# Patient Record
Sex: Female | Born: 1955 | Race: Asian | Hispanic: No | Marital: Married | Smoking: Never smoker
Health system: Southern US, Community
[De-identification: ages and names within clinical notes are randomized; demographics above are authoritative.]

## PROBLEM LIST (undated history)

## (undated) DIAGNOSIS — E119 Type 2 diabetes mellitus without complications: Secondary | ICD-10-CM

## (undated) DIAGNOSIS — K219 Gastro-esophageal reflux disease without esophagitis: Secondary | ICD-10-CM

## (undated) DIAGNOSIS — M1990 Unspecified osteoarthritis, unspecified site: Secondary | ICD-10-CM

## (undated) DIAGNOSIS — I10 Essential (primary) hypertension: Secondary | ICD-10-CM

## (undated) DIAGNOSIS — H539 Unspecified visual disturbance: Secondary | ICD-10-CM

## (undated) HISTORY — PX: ESOPHAGOGASTRODUODENOSCOPY ENDOSCOPY: SHX5814

## (undated) HISTORY — DX: Gastro-esophageal reflux disease without esophagitis: K21.9

## (undated) HISTORY — DX: Essential (primary) hypertension: I10

## (undated) HISTORY — DX: Type 2 diabetes mellitus without complications: E11.9

## (undated) HISTORY — DX: Unspecified visual disturbance: H53.9

## (undated) HISTORY — DX: Unspecified osteoarthritis, unspecified site: M19.90

---

## 2018-05-17 ENCOUNTER — Ambulatory Visit: Payer: TRICARE Prime—HMO

## 2018-05-18 ENCOUNTER — Ambulatory Visit: Payer: TRICARE Prime—HMO | Attending: Gastroenterology

## 2018-05-26 ENCOUNTER — Ambulatory Visit: Payer: Self-pay

## 2018-05-26 ENCOUNTER — Ambulatory Visit: Payer: TRICARE Prime—HMO | Admitting: Pain Medicine

## 2018-05-26 ENCOUNTER — Encounter
Admission: RE | Disposition: A | Discharge status: Home / Self Care | Payer: Self-pay | Source: Ambulatory Visit | Attending: Gastroenterology

## 2018-05-26 ENCOUNTER — Ambulatory Visit
Admission: RE | Admit: 2018-05-26 | Discharge: 2018-05-26 | Disposition: A | Discharge status: Home / Self Care | Payer: TRICARE Prime—HMO | Source: Ambulatory Visit | Attending: Gastroenterology | Admitting: Gastroenterology

## 2018-05-26 DIAGNOSIS — I10 Essential (primary) hypertension: Secondary | ICD-10-CM | POA: Insufficient documentation

## 2018-05-26 DIAGNOSIS — K2950 Unspecified chronic gastritis without bleeding: Secondary | ICD-10-CM

## 2018-05-26 DIAGNOSIS — K3189 Other diseases of stomach and duodenum: Secondary | ICD-10-CM | POA: Insufficient documentation

## 2018-05-26 DIAGNOSIS — K219 Gastro-esophageal reflux disease without esophagitis: Secondary | ICD-10-CM

## 2018-05-26 DIAGNOSIS — K2940 Chronic atrophic gastritis without bleeding: Secondary | ICD-10-CM | POA: Insufficient documentation

## 2018-05-26 DIAGNOSIS — E119 Type 2 diabetes mellitus without complications: Secondary | ICD-10-CM | POA: Insufficient documentation

## 2018-05-26 DIAGNOSIS — Z7984 Long term (current) use of oral hypoglycemic drugs: Secondary | ICD-10-CM | POA: Insufficient documentation

## 2018-05-26 DIAGNOSIS — R12 Heartburn: Secondary | ICD-10-CM | POA: Insufficient documentation

## 2018-05-26 LAB — GLUCOSE WHOLE BLOOD - POCT: Whole Blood Glucose POCT: 95 mg/dL (ref 70–100)

## 2018-05-26 SURGERY — DONT USE, USE 1095-ESOPHAGOGASTRODUODENOSCOPY (EGD), DIAGNOSTIC
Anesthesia: Anesthesia General | Site: Abdomen | Wound class: Clean Contaminated

## 2018-05-26 MED ORDER — LIDOCAINE HCL (CARDIAC) 100 MG/5ML IV (WRAP)
PREFILLED_SYRINGE | INTRAVENOUS | Status: AC
Start: 2018-05-26 — End: ?
  Filled 2018-05-26: qty 5

## 2018-05-26 MED ORDER — PROPOFOL 10 MG/ML IV EMUL (WRAP)
INTRAVENOUS | Status: AC
Start: 2018-05-26 — End: ?
  Filled 2018-05-26: qty 20

## 2018-05-26 MED ORDER — FENTANYL CITRATE (PF) 50 MCG/ML IJ SOLN (WRAP)
INTRAMUSCULAR | Status: AC
Start: 2018-05-26 — End: ?
  Filled 2018-05-26: qty 2

## 2018-05-26 MED ORDER — LIDOCAINE HCL 2 % IJ SOLN
INTRAMUSCULAR | Status: DC | PRN
Start: 2018-05-26 — End: 2018-05-26
  Administered 2018-05-26: 100 mg

## 2018-05-26 MED ORDER — PROPOFOL INFUSION 10 MG/ML
INTRAVENOUS | Status: DC | PRN
Start: 2018-05-26 — End: 2018-05-26
  Administered 2018-05-26: 60 mg via INTRAVENOUS

## 2018-05-26 MED ORDER — FENTANYL CITRATE (PF) 50 MCG/ML IJ SOLN (WRAP)
INTRAMUSCULAR | Status: DC | PRN
Start: 2018-05-26 — End: 2018-05-26
  Administered 2018-05-26: 25 ug via INTRAVENOUS

## 2018-05-26 MED ORDER — SODIUM CHLORIDE 0.9 % IV SOLN
INTRAVENOUS | Status: DC
Start: 2018-05-26 — End: 2018-05-26

## 2018-05-26 SURGICAL SUPPLY — 44 items
BLOCK BITE MAXI 60FR LF STRD STRAP SDPRT (Procedure Accessories) ×1
BLOCK BITE OD60 FR STURDY STRAP SIDEPORT (Procedure Accessories) ×1
BLOCK BITE OD60 FR STURDY STRAP SIDEPORT DENTAL RETENTION RIM MAXI (Procedure Accessories) ×1 IMPLANT
CATHETER BALLOON DILATATION CRE 2.8 MM (Balloons)
CATHETER BALLOON DILATATION CRE PEBAX (Balloons)
CATHETER OD10-11-12 MM ODSEC6 FR L180 CM CRE BALLOON DILATATION L8 CM (Balloons) IMPLANT
CATHETER OD10-11-12 MM ODSEC6 FR L180 CM CREâ„¢ BALLOON DILATATION L8 CM (Balloons) IMPLANT
CATHETER OD15-16.5-18 MM ODSEC6 FR L180 CM CRE BALLOON DILATATION L8 (Balloons) IMPLANT
CATHETER OD15-16.5-18 MM ODSEC6 FR L180 CM CREâ„¢ BALLOON DILATATION L8 (Balloons) IMPLANT
CATHETER OD6 FR ODSEC12-13.5-15 MM L180 CM CRE BALLOON DILATATION L8 (Balloons) IMPLANT
CATHETER OD6 FR ODSEC12-13.5-15 MM L180 CM CREâ„¢ BALLOON DILATATION L8 (Balloons) IMPLANT
CATHETER OD6-7-8 MM ODSEC6 FR L180 CM CRE BALLOON DILATATION L8 CM (Balloons) IMPLANT
CATHETER OD6-7-8 MM ODSEC6 FR L180 CM CREâ„¢ BALLOON DILATATION L8 CM (Balloons) IMPLANT
DILATOR ESCP PEBAX 2.8MM CRE 10-11-12MM (Balloons)
DILATOR ESCP PEBAX 2.8MM CRE 15-16.5-18 (Balloons)
DILATOR ESCP PEBAX 2.8MM CRE 6-7-8MM 6FR (Balloons)
DILATOR ESCP PEBAX CRE 6FR 12-13.5-15MM (Balloons)
ELECTRODE ADULT PATIENT RETURN L9 FT REM POLYHESIVE ACRYLIC FOAM (Procedure Accessories) IMPLANT
ELECTRODE PATIENT RETURN L9 FT VALLEYLAB (Procedure Accessories)
ELECTRODE PT RTN RM PHSV ACRL FM C30- LB (Procedure Accessories)
FORCEPS BIOPSY L240 CM LARGE CAPACITY (Instrument) ×1
FORCEPS BIOPSY L240 CM MICROMESH TEETH STREAMLINE CATHETER NEEDLE (Instrument) ×1 IMPLANT
FORCEPS BX SS LG CPC RJ 4 2.4MM 240CM (Instrument) ×1
GLOVE EXAM LARGE NITRILE CHEMOTHERAPY POWDER FREE SENSE OATMEAL (Glove) ×2 IMPLANT
GLOVE EXAM LARGE NITRILE POWDER FREE SENSE OATMEAL (Glove) ×2 IMPLANT
GLOVE EXAM NITRILE RESTORE LG (Glove) ×2
GLV EXAM NITRILE RESTORE LG (Glove) ×4
GOWN ISL PP PE REG LG LF FULL BCK NK TIE (Gown) ×4
GOWN ISOLATION REGULAR LARGE FULL BACK NECK TIE ELASTIC CUFF (Gown) ×2 IMPLANT
PROBE ELECTROSURGICAL L220 CM FLEXIBLE (Procedure Accessories)
PROBE ELECTROSURGICAL L220 CM FLEXIBLE STRAIGHT FIRE OD2.3 MM FIAPC (Procedure Accessories) IMPLANT
PROBE ESURG FIAPC 2.3MM 220CM STRL FLXB (Procedure Accessories)
SNARE ESCP MIC CPTVTR 13MM 240IN STRL (GE Lab Supplies)
SNARE SMALL HEXAGON CAPTIVATOR STIFF ENDOSCOPIC POLYPECTOMY (GE Lab Supplies) IMPLANT
SPONGE GAUZE L4 IN X W4 IN 16 PLY (Dressing) ×1
SPONGE GAUZE L4 IN X W4 IN 16 PLY MAXIMUM ABSORBENT USP TYPE VII (Dressing) ×1 IMPLANT
SPONGE GZE CTTN CRTY 4X4IN LF NS 16 PLY (Dressing) ×1
SYRINGE 50 ML GRADUATE NONPYROGENIC DEHP (Syringes, Needles)
SYRINGE 50 ML GRADUATE NONPYROGENIC DEHP FREE PVC FREE BD MEDICAL (Syringes, Needles) IMPLANT
SYRINGE INFL 60ML ALN II STRL GA DISP (Syringes, Needles)
SYRINGE INFLATION 60 ML GAUGE CRE (Syringes, Needles) IMPLANT
SYRINGE MED 50ML LF STRL GRAD N-PYRG (Syringes, Needles)
WATER STERILE PLASTIC POUR BOTTLE 250 ML (Irrigation Solutions) ×1 IMPLANT
WATER STRL 250ML LF PLS PR BTL (Irrigation Solutions) ×1

## 2018-05-26 NOTE — Transfer of Care (Signed)
Anesthesia Transfer of Care Note    Patient: Jessica Yang    Procedures performed: Procedure(s) with comments:  EGD - EGD  Q1=N/A    Anesthesia type: General TIVA    Patient location:Phase II PACU    Last vitals:   Vitals:    05/26/18 1000   BP: 103/69   Pulse: (!) 59   Resp: 16   Temp: 37.1 C (98.7 F)   SpO2: 97%       Post pain: Patient not complaining of pain, continue current therapy      Mental Status:awake    Respiratory Function: tolerating room air    Cardiovascular: stable    Nausea/Vomiting: patient not complaining of nausea or vomiting    Hydration Status: adequate    Post assessment: no apparent anesthetic complications    Signed by: Hoyle Sauer Ayodele Sangalang  05/26/18 11:02 AM

## 2018-05-26 NOTE — Anesthesia Postprocedure Evaluation (Signed)
Anesthesia Post Evaluation    Patient: Jessica Yang    Procedure(s) with comments:  EGD - EGD  Q1=N/A    Anesthesia type: general    Last Vitals:   Vitals Value Taken Time   BP 97/55 05/26/2018 11:00 AM   Temp 36.3 C (97.4 F) 05/26/2018 11:00 AM   Pulse 62 05/26/2018 11:00 AM   Resp 16 05/26/2018 11:00 AM   SpO2 94 % 05/26/2018 11:00 AM                 Anesthesia Post Evaluation:     Patient Evaluated: PACU  Patient Participation: complete - patient participated  Level of Consciousness: awake  Pain Score: 0  Pain Management: adequate    Airway Patency: patent    Anesthetic complications: No      PONV Status: none    Cardiovascular status: stable  Respiratory status: room air  Hydration status: stable        Signed by: Juanell Fairly, 05/26/2018 11:12 AM

## 2018-05-26 NOTE — Progress Notes (Signed)
Discharge instructions reviewed with patient and responsible adult at this time.  All questions answered at this time.  A signed copy of discharge instructions by responsible adult placed in chart.  A copy of discharge instructions given to patient and responsible adult. Plan to d/c home.

## 2018-05-26 NOTE — H&P (Signed)
GI PRE PROCEDURE NOTE    Proceduralist Comments:   Review of Systems and Past Medical / Surgical History performed: Yes     Indications:GERD     Previous Adverse Reaction to Anesthesia or Sedation (if yes, describe): No    Physical Exam / Laboratory Data (If applicable)   Airway Classification: Class II    General: Alert and cooperative  Lungs: Lungs clear to auscultation  Cardiac: RRR, normal S1S2.    Abdomen: Soft, non tender. Normal active bowel sounds  Other:     No labs drawn    American Society of Anesthesiologists (ASA) Physical Status Classification:   ASA 2 - Patient with mild systemic disease with no functional limitations    Planned Sedation:   Deep sedation with anesthesia    Attestation:   Jessica Yang has been reassessed immediately prior to the procedure and is an appropriate candidate for the planned sedation and procedure. Risks, benefits and alternatives to the planned procedure and sedation have been explained to the patient or guardian:  yes        Signed by: Karen Kays

## 2018-05-26 NOTE — Anesthesia Preprocedure Evaluation (Addendum)
Anesthesia Evaluation    AIRWAY    Mallampati: II    TM distance: >3 FB  Neck ROM: full  Mouth Opening:full  Planned to use difficult airway equipment: No CARDIOVASCULAR    cardiovascular exam normal, regular and normal       DENTAL         PULMONARY    pulmonary exam normal     OTHER FINDINGS                  Relevant Problems   No relevant active problems       PSS Anesthesia Comments: DM, HTN,         Anesthesia Plan    ASA 2     general                     intravenous induction   Detailed anesthesia plan: general IV  Monitors/Adjuncts: other    Post Op: other  Trial extubation is not planned.  Post op pain management: per surgeon    informed consent obtained                   Signed by: Juanell Fairly 05/26/18 7:21 AM

## 2018-05-26 NOTE — Discharge Instr - AVS First Page (Addendum)
Endoscopy Discharge Instructions  General Instructions:  1. Following sedation, your judgement, perception, and coordination are considered impaired. Even though you may feel awake and alert, you are considered legally intoxicated. Therefore, until the next morning;   Do not Drive   Do not operate appliances or equipment that requires reaction time (e.g. Stove,electrical tools, machinery)   Do not sign legal documents or be involved in important decisions.   Do not smoke if alone   Do not drink alcoholic beverages   Go directly home and rest for several hours before resuming your routine activities.   It is highly recommended to have a responsible adult stay with you for the next 24 hours    2. Tenderness, swelling or pain may occur at the IV site where you received sedation. If you experience this, apply warm soaks to the area. Notify your physician if this persists.    Instructions Specific To Procedures - Report To Physician Any Of The Following:    Upper Endoscopy, ERCP, Dilations   1. Pain in Chest   2. Nausea/vomitting   3. Fevers/Chills within 24 hours after procedure. Temp>101deg F   4. Severe and persistent abdominal pain and bloating     In Addition:   Mild throat soreness may follow this procedure. Warm salt water gargling or lozenges of your choice will most likely relieve your discomfort.  Cold drinks and popsicles may be helpful as well.    Additional Discharge Instructions  Your diet after the procedure: Continue prior diet  Special Instructions: none  Prescriptions given: No  Patient education literature given; Yes      If you have questions or problems contact your MD immediately. If you need immediate attention, call your MD, 911 and/or go to nearest emergency room.    Post Anesthesia Discharge Instructions    Although you may be awake and alert in the recovery room, small amounts of anesthetic remain in your system for about 24 hours.  You may feel tired and sleepy during this time.       You are advised to go directly home from the hospital.    Plan to stay at home and rest for the remainder of the day.    It is advisable to have someone with you at home for 24 hours after surgery.    Do not operate a motor vehicle, or any mechanical or electrical equipment for the next 24 hours.      Be careful when you are walking around, you may become dizzy.  The effects of anesthesia and/or medications are still present and drowsiness may occur    Do not consume alcohol, tranquilizers, sleeping medications, or any other non prescribed medication for the remainder of the day.    Diet:  begin with liquids, progress your diet as tolerated or as directed by your surgeon.  Nausea and vomiting may occur in the next 24 hours.

## 2018-05-27 ENCOUNTER — Encounter: Payer: Self-pay | Admitting: Gastroenterology

## 2021-03-27 ENCOUNTER — Ambulatory Visit: Payer: Self-pay | Attending: Internal Medicine

## 2021-03-27 DIAGNOSIS — Z23 Encounter for immunization: Secondary | ICD-10-CM

## 2021-03-27 NOTE — Progress Notes (Signed)
   Covid-19 Vaccination Clinic  Name:  Allison Hale    MRN: [id number] DOB: September 08, 1955  03/27/2021  [name] was observed post Covid-19 immunization for 15 minutes without incident. She was provided with Vaccine Information Sheet and instruction to access the V-Safe system.   [name] was instructed to call 911 with any severe reactions post vaccine: Difficulty breathing  Swelling of face and throat  A fast heartbeat  A bad rash all over body  Dizziness and weakness   Immunizations Administered     Name Date Dose VIS Date Route   Moderna Covid-19 Booster Vaccine 03/27/2021 12:54 PM 0.25 mL 06/12/2020 Intramuscular   Manufacturer: Moderna   Lot: [id number]   NDC: 70488-891-69

## 2021-03-31 ENCOUNTER — Other Ambulatory Visit (HOSPITAL_BASED_OUTPATIENT_CLINIC_OR_DEPARTMENT_OTHER): Payer: Self-pay

## 2021-03-31 MED ORDER — COVID-19 MRNA VACC (MODERNA) 100 MCG/0.5ML IM SUSP
INTRAMUSCULAR | 0 refills | Status: AC
  Filled 2021-03-31: qty 0.25, 1d supply, fill #0

## 2021-09-26 ENCOUNTER — Emergency Department (HOSPITAL_BASED_OUTPATIENT_CLINIC_OR_DEPARTMENT_OTHER)

## 2021-09-26 ENCOUNTER — Encounter (HOSPITAL_BASED_OUTPATIENT_CLINIC_OR_DEPARTMENT_OTHER): Payer: Self-pay | Admitting: Emergency Medicine

## 2021-09-26 ENCOUNTER — Other Ambulatory Visit: Payer: Self-pay

## 2021-09-26 ENCOUNTER — Emergency Department (HOSPITAL_BASED_OUTPATIENT_CLINIC_OR_DEPARTMENT_OTHER)
Admission: EM | Admit: 2021-09-26 | Discharge: 2021-09-26 | Disposition: A | Discharge status: Home / Self Care | Attending: Emergency Medicine | Admitting: Emergency Medicine

## 2021-09-26 DIAGNOSIS — R1011 Right upper quadrant pain: Secondary | ICD-10-CM | POA: Diagnosis present

## 2021-09-26 DIAGNOSIS — R1031 Right lower quadrant pain: Secondary | ICD-10-CM | POA: Insufficient documentation

## 2021-09-26 DIAGNOSIS — Z20822 Contact with and (suspected) exposure to covid-19: Secondary | ICD-10-CM | POA: Insufficient documentation

## 2021-09-26 DIAGNOSIS — J189 Pneumonia, unspecified organism: Secondary | ICD-10-CM

## 2021-09-26 HISTORY — DX: Type 2 diabetes mellitus without complications: E11.9

## 2021-09-26 HISTORY — DX: Gastro-esophageal reflux disease without esophagitis: K21.9

## 2021-09-26 HISTORY — DX: Essential (primary) hypertension: I10

## 2021-09-26 LAB — CBC WITH DIFFERENTIAL/PLATELET
Abs Immature Granulocytes: 0.03 10*3/uL (ref 0.00–0.07)
Basophils Absolute: 0.1 10*3/uL (ref 0.0–0.1)
Basophils Relative: 0 %
Eosinophils Absolute: 0.2 10*3/uL (ref 0.0–0.5)
Eosinophils Relative: 1 %
HCT: 35.9 % — ABNORMAL LOW (ref 36.0–46.0)
Hemoglobin: 12.2 g/dL (ref 12.0–15.0)
Immature Granulocytes: 0 %
Lymphocytes Relative: 26 %
Lymphs Abs: 2.9 10*3/uL (ref 0.7–4.0)
MCH: 32 pg (ref 26.0–34.0)
MCHC: 34 g/dL (ref 30.0–36.0)
MCV: 94.2 fL (ref 80.0–100.0)
Monocytes Absolute: 0.9 10*3/uL (ref 0.1–1.0)
Monocytes Relative: 8 %
Neutro Abs: 7.4 10*3/uL (ref 1.7–7.7)
Neutrophils Relative %: 65 %
Platelets: 260 10*3/uL (ref 150–400)
RBC: 3.81 MIL/uL — ABNORMAL LOW (ref 3.87–5.11)
RDW: 12 % (ref 11.5–15.5)
WBC: 11.4 10*3/uL — ABNORMAL HIGH (ref 4.0–10.5)
nRBC: 0 % (ref 0.0–0.2)

## 2021-09-26 LAB — COMPREHENSIVE METABOLIC PANEL
ALT: 25 U/L (ref 0–44)
AST: 26 U/L (ref 15–41)
Albumin: 3.9 g/dL (ref 3.5–5.0)
Alkaline Phosphatase: 75 U/L (ref 38–126)
Anion gap: 11 (ref 5–15)
BUN: 15 mg/dL (ref 8–23)
CO2: 24 mmol/L (ref 22–32)
Calcium: 9.3 mg/dL (ref 8.9–10.3)
Chloride: 101 mmol/L (ref 98–111)
Creatinine, Ser: 0.78 mg/dL (ref 0.44–1.00)
GFR, Estimated: >60 mL/min (ref >60)
Glucose, Bld: 158 mg/dL — ABNORMAL HIGH (ref 70–99)
Potassium: 3.8 mmol/L (ref 3.5–5.1)
Sodium: 136 mmol/L (ref 135–145)
Total Bilirubin: 0.8 mg/dL (ref 0.3–1.2)
Total Protein: 7.6 g/dL (ref 6.5–8.1)

## 2021-09-26 LAB — URINALYSIS, ROUTINE W REFLEX MICROSCOPIC
Bilirubin Urine: NEGATIVE
Glucose, UA: NEGATIVE mg/dL
Ketones, ur: NEGATIVE mg/dL
Leukocytes,Ua: NEGATIVE
Nitrite: NEGATIVE
Protein, ur: 100 mg/dL — AB
Specific Gravity, Urine: 1.02 (ref 1.005–1.030)
pH: 5.5 (ref 5.0–8.0)

## 2021-09-26 LAB — URINALYSIS, MICROSCOPIC (REFLEX)

## 2021-09-26 LAB — RESP PANEL BY RT-PCR (FLU A&B, COVID) ARPGX2
Influenza A by PCR: NEGATIVE
Influenza B by PCR: NEGATIVE
SARS Coronavirus 2 by RT PCR: NEGATIVE

## 2021-09-26 MED ORDER — ALUM & MAG HYDROXIDE-SIMETH 200-200-20 MG/5ML PO SUSP
30.0000 mL | Freq: Once | ORAL | Status: AC
  Administered 2021-09-26: 30 mL via ORAL
  Filled 2021-09-26: qty 30

## 2021-09-26 MED ORDER — KETOROLAC TROMETHAMINE 30 MG/ML IJ SOLN
15.0000 mg | Freq: Once | INTRAMUSCULAR | Status: AC
  Administered 2021-09-26: 15 mg via INTRAVENOUS
  Filled 2021-09-26: qty 1

## 2021-09-26 MED ORDER — ACETAMINOPHEN 500 MG PO TABS
1000.0000 mg | ORAL_TABLET | Freq: Once | ORAL | Status: AC
  Administered 2021-09-26: 1000 mg via ORAL
  Filled 2021-09-26: qty 2

## 2021-09-26 NOTE — ED Provider Notes (Addendum)
[facility] EMERGENCY DEPARTMENT Provider Note   CSN: [id number] Arrival date & time: 09/26/21  3704     History  Chief Complaint  Patient presents with   Abdominal Pain    Allison Hale is a 66 y.o. female.  The history is provided by the patient.  Abdominal Pain Pain location:  RUQ and RLQ Pain radiates to:  Does not radiate Pain severity:  Moderate Onset quality:  Gradual Duration:  2 days (worse with coughing) Timing:  Constant Progression:  Waxing and waning Chronicity:  New Context: not trauma   Context comment:  Coughing Worsened by:  Nothing Ineffective treatments:  None tried Associated symptoms: cough and sore throat   Associated symptoms: no chest pain, no constipation, no diarrhea, no dysuria, no fever, no nausea, no shortness of breath and no vomiting   Risk factors: has not had multiple surgeries   Patient with GERD not currently taking medications presents with right sided abdominal pain.  Seen by PMD Wednesday for cough, ear pain, sore throat and started on Amoxicillin.  No flu or covid done then started having abdominal pain after leaving the office.  It is much worse with cough.  There are no associated symptoms: no n/v/d.  No urinary symptoms.  No fevers.  Started on Amoxicillin 2 days ago for these symptoms    Home Medications Prior to Admission medications   Medication Sig Start Date End Date Taking? Authorizing Provider  COVID-19 mRNA vaccine, Moderna, 100 MCG/0.5ML injection Inject into the muscle. 03/27/21   Judyann Munson, MD      Allergies    Patient has no known allergies.    Review of Systems   Review of Systems  Constitutional:  Negative for fever.  HENT:  Positive for ear pain and sore throat.   Eyes:  Negative for redness.  Respiratory:  Positive for cough. Negative for shortness of breath.   Cardiovascular:  Negative for chest pain.  Gastrointestinal:  Positive for abdominal pain. Negative for constipation, diarrhea, nausea and  vomiting.  Genitourinary:  Negative for dysuria.  Musculoskeletal:  Negative for back pain.  All other systems reviewed and are negative.  Physical Exam Updated Vital Signs BP (!) 183/83 (BP Location: Right Arm)    Pulse 75    Temp 99.3 F (37.4 C) (Oral)    Resp 20    Ht 5\' 2"  (1.575 m)    Wt 73.5 kg    SpO2 99%    BMI 29.63 kg/m  Physical Exam Vitals and nursing note reviewed. Exam conducted with a chaperone present.  Constitutional:      General: She is not in acute distress.    Appearance: Normal appearance.  HENT:     Head: Normocephalic and atraumatic.     Nose: Nose normal.  Eyes:     Conjunctiva/sclera: Conjunctivae normal.     Pupils: Pupils are equal, round, and reactive to light.  Cardiovascular:     Rate and Rhythm: Normal rate and regular rhythm.     Pulses: Normal pulses.     Heart sounds: Normal heart sounds.  Pulmonary:     Effort: Pulmonary effort is normal. No respiratory distress.     Breath sounds: Normal breath sounds. No wheezing or rales.  Abdominal:     General: Abdomen is flat. Bowel sounds are normal.     Palpations: Abdomen is soft.     Tenderness: There is no abdominal tenderness. There is no guarding or rebound.  Musculoskeletal:  General: Normal range of motion.     Cervical back: Normal range of motion and neck supple.  Skin:    General: Skin is warm and dry.     Capillary Refill: Capillary refill takes less than 2 seconds.  Neurological:     General: No focal deficit present.     Mental Status: She is alert and oriented to person, place, and time.     Deep Tendon Reflexes: Reflexes normal.  Psychiatric:        Mood and Affect: Mood normal.    ED Results / Procedures / Treatments   Labs (all labs ordered are listed, but only abnormal results are displayed) Results for orders placed or performed during the hospital encounter of 09/26/21  CBC with Differential/Platelet  Result Value Ref Range   WBC 11.4 (H) 4.0 - 10.5 K/uL   RBC  3.81 (L) 3.87 - 5.11 MIL/uL   Hemoglobin 12.2 12.0 - 15.0 g/dL   HCT 16.1 (L) 09.6 - 04.5 %   MCV 94.2 80.0 - 100.0 fL   MCH 32.0 26.0 - 34.0 pg   MCHC 34.0 30.0 - 36.0 g/dL   RDW 40.9 81.1 - 91.4 %   Platelets 260 150 - 400 K/uL   nRBC 0.0 0.0 - 0.2 %   Neutrophils Relative % 65 %   Neutro Abs 7.4 1.7 - 7.7 K/uL   Lymphocytes Relative 26 %   Lymphs Abs 2.9 0.7 - 4.0 K/uL   Monocytes Relative 8 %   Monocytes Absolute 0.9 0.1 - 1.0 K/uL   Eosinophils Relative 1 %   Eosinophils Absolute 0.2 0.0 - 0.5 K/uL   Basophils Relative 0 %   Basophils Absolute 0.1 0.0 - 0.1 K/uL   Immature Granulocytes 0 %   Abs Immature Granulocytes 0.03 0.00 - 0.07 K/uL  Comprehensive metabolic panel  Result Value Ref Range   Sodium 136 135 - 145 mmol/L   Potassium 3.8 3.5 - 5.1 mmol/L   Chloride 101 98 - 111 mmol/L   CO2 24 22 - 32 mmol/L   Glucose, Bld 158 (H) 70 - 99 mg/dL   BUN 15 8 - 23 mg/dL   Creatinine, Ser 7.82 0.44 - 1.00 mg/dL   Calcium 9.3 8.9 - 95.6 mg/dL   Total Protein 7.6 6.5 - 8.1 g/dL   Albumin 3.9 3.5 - 5.0 g/dL   AST 26 15 - 41 U/L   ALT 25 0 - 44 U/L   Alkaline Phosphatase 75 38 - 126 U/L   Total Bilirubin 0.8 0.3 - 1.2 mg/dL   GFR, Estimated >21 >30 mL/min   Anion gap 11 5 - 15   No results found.   Radiology No results found.  Procedures Procedures    Medications Ordered in ED Medications  acetaminophen (TYLENOL) tablet 1,000 mg (1,000 mg Oral Given 09/26/21 0600)  alum & mag hydroxide-simeth (MAALOX/MYLANTA) 200-200-20 MG/5ML suspension 30 mL (30 mLs Oral Given 09/26/21 0600)  ketorolac (TORADOL) 30 MG/ML injection 15 mg (15 mg Intravenous Given 09/26/21 8657)    ED Course/ Medical Decision Making/ A&P                           Medical Decision Making Abdominal pain with URI symptoms.  Seen for URI but pain developed post visit on antibiotics for URI  Amount and/or Complexity of Data Reviewed Labs: ordered.    Details: normal electrolytes, normal  CBC Radiology: ordered.    Details: Radiology  questions PNA patient is already on amoxicillin  Risk OTC drugs. Prescription drug management. Risk Details: Patient with URI symptoms and now abdominal pain worse with coughing.  CXR with questionable PNA patient is on day 2 of amoxicillin encouraged to continue this.  CT and urinalysis ordered and pending     Final Clinical Impression(s) / ED Diagnoses Final diagnoses:  None   Signed out to Dr. Lockie Molauratolo pending CT and urinalysis        Crystallee Werden, MD 09/26/21 [id number]

## 2021-09-26 NOTE — ED Triage Notes (Signed)
Patient arrived via POV c/o abdominal pain x days. Patient denies nausea/vomiting. Patient states 7/10 pain to upper right quadrant. Patient also states sinus pain/pressure with sore throat. Patient is AO x 4, VS with elevated BP, normal gait.

## 2021-09-26 NOTE — ED Notes (Signed)
Patient aware of need for urine specimen. Unable to provide one at this time.

## 2021-09-26 NOTE — ED Provider Notes (Signed)
Patient with negative urinalysis.  Has some abdominal pain but overall unremarkable CT scan.  She is not tender in the right upper quadrant.  No concern for cholelithiasis.  She is already on antibiotics for viral process.  Questionable pneumonia on x-ray.  Discharged in good condition.   Lennice Sites, DO 09/26/21 [phone]

## 2021-10-21 ENCOUNTER — Encounter (HOSPITAL_COMMUNITY): Payer: Self-pay | Admitting: Radiology

## 2021-10-22 ENCOUNTER — Encounter (HOSPITAL_COMMUNITY): Payer: Self-pay | Admitting: Radiology

## 2023-04-03 IMAGING — CT CT RENAL STONE PROTOCOL
2 of 4 series · 16 of 46 positions shown, 18 images · non-contrast
Comparison: None.

CLINICAL DATA: 65 year old female with flank and right upper
quadrant abdominal pain. Suspected kidney stone.



[Series 2: axial st · axial · 0.91mm/px · z∈[+742,+1162]mm · 13 of 92 slices shown, 15 images]
[im 4/92  soft-tissue]
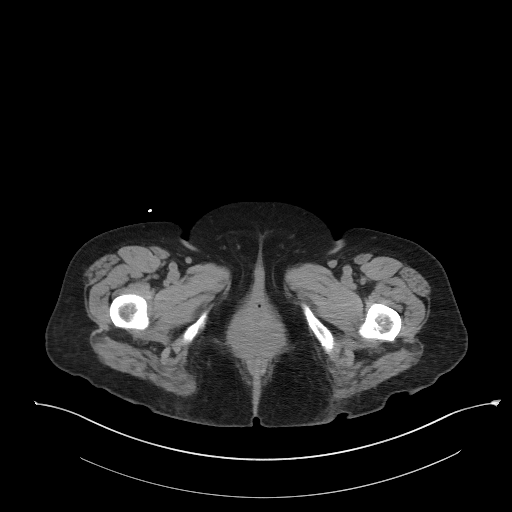
[im 4/92  bone]
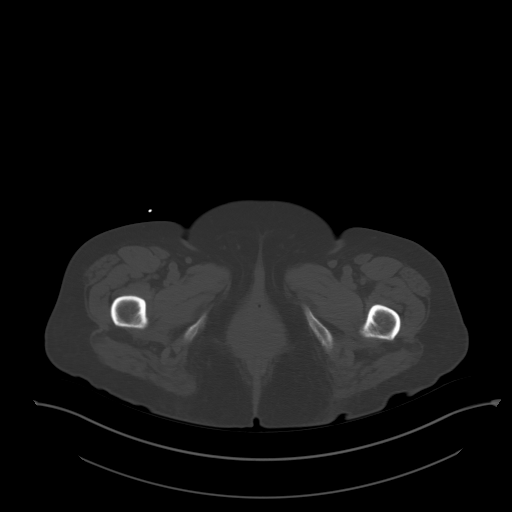
[im 11/92  soft-tissue]
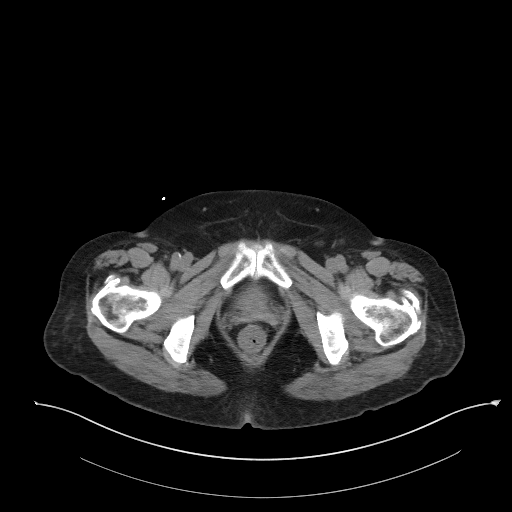
[im 18/92  soft-tissue]
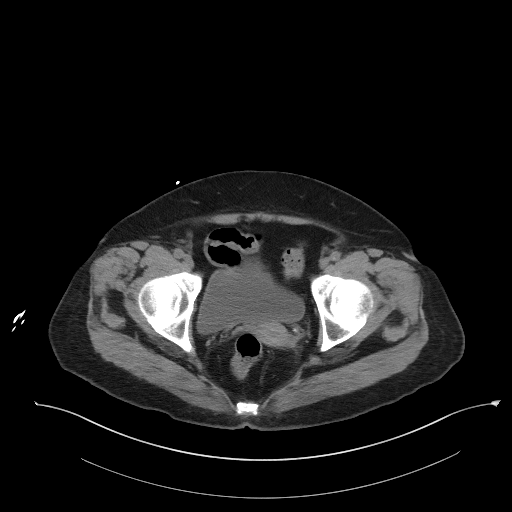
[im 25/92  soft-tissue]
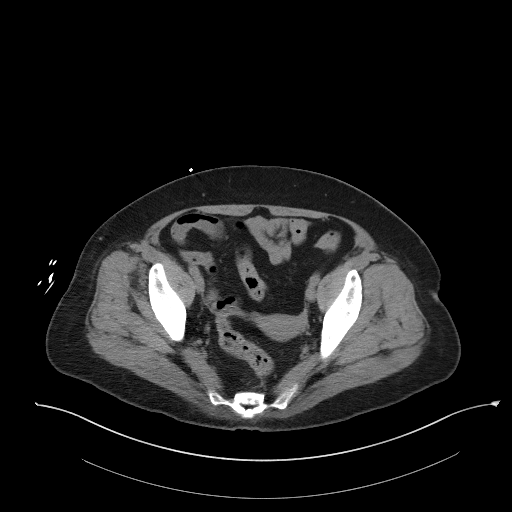
[im 32/92  soft-tissue]
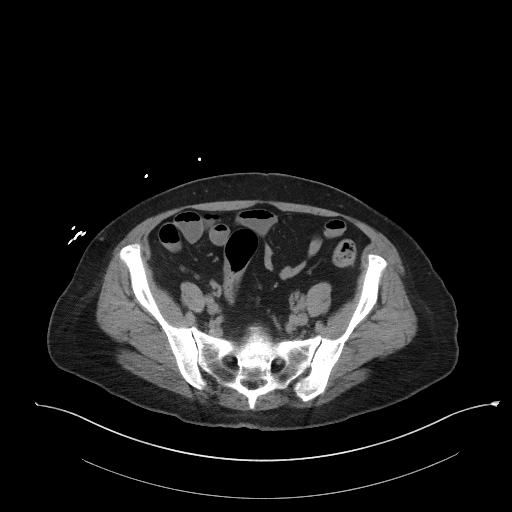
[im 39/92  soft-tissue]
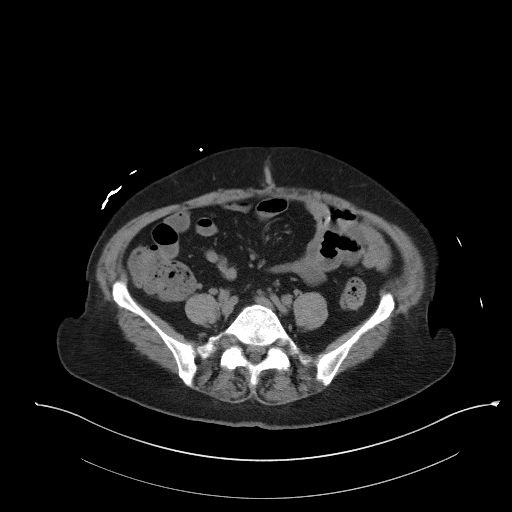
[im 46/92  soft-tissue]
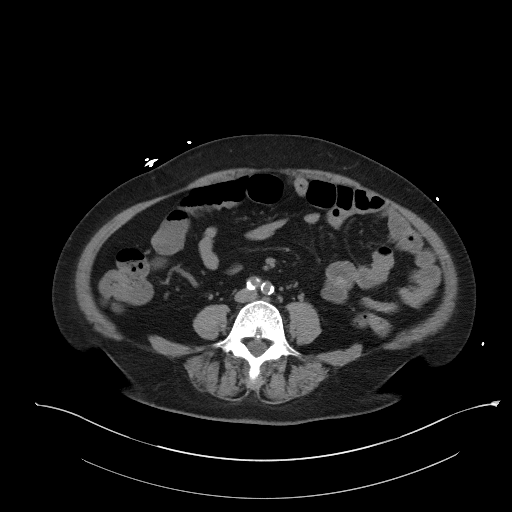
[im 53/92  soft-tissue]
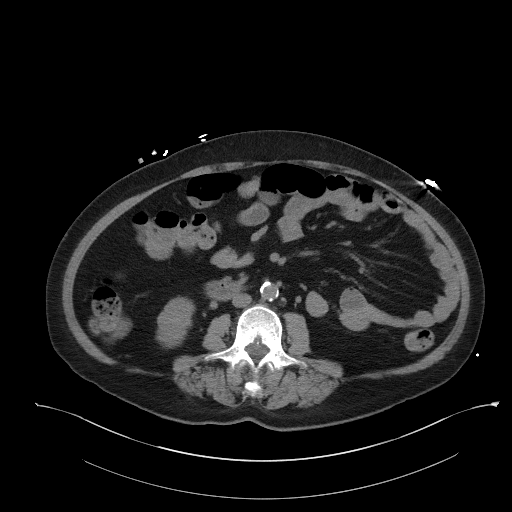
[im 60/92  soft-tissue]
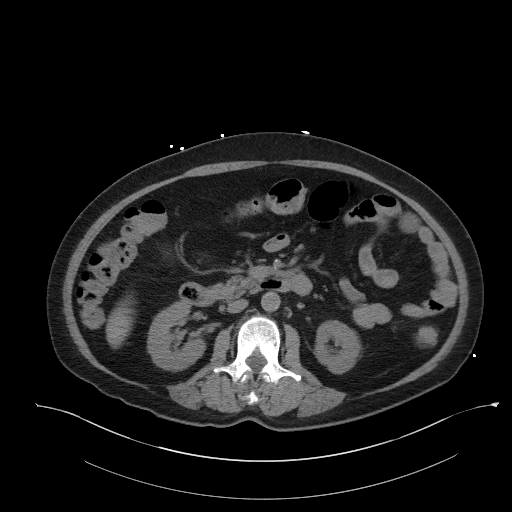
[im 60/92  bone]
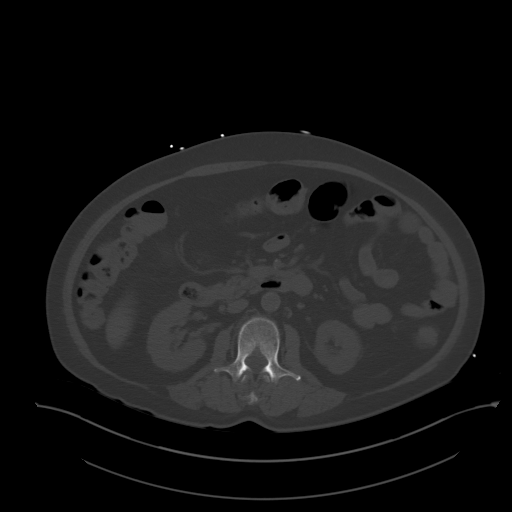
[im 67/92  soft-tissue]
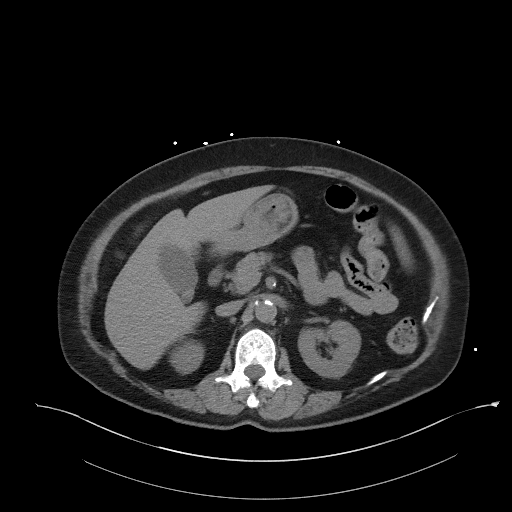
[im 74/92  soft-tissue]
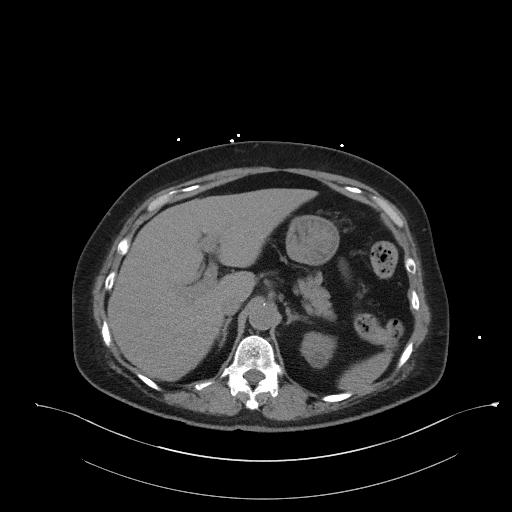
[im 81/92  soft-tissue]
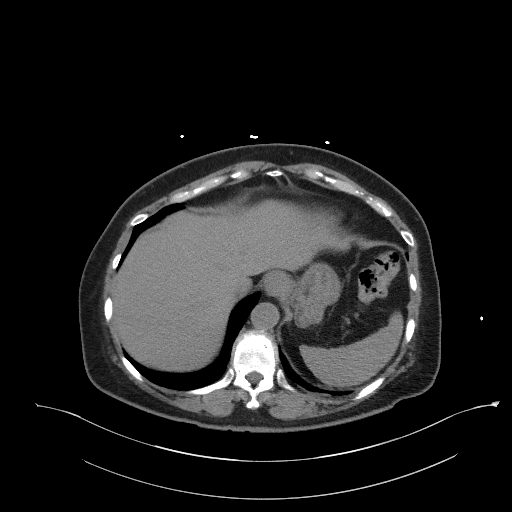
[im 88/92  soft-tissue]
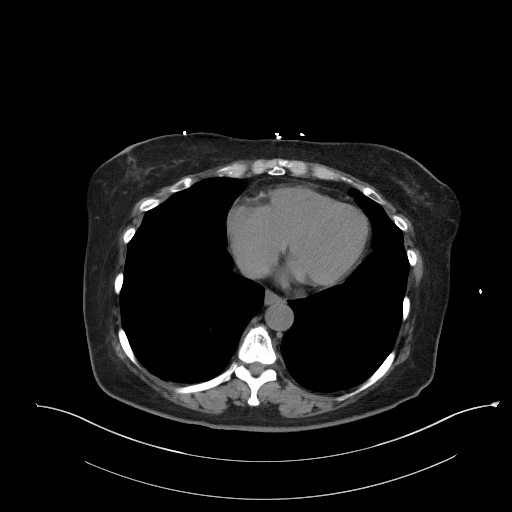

[Series 5: coronal st · coronal · 0.77mm/px · 3 of 87 slices shown]
[im 29/87  soft-tissue]
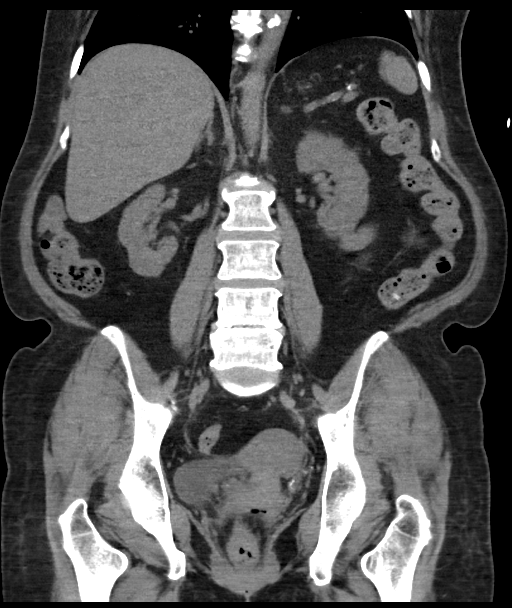
[im 39/87  soft-tissue]
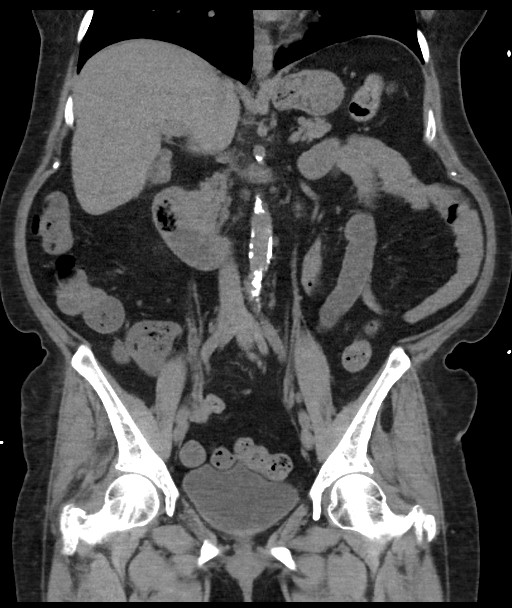
[im 48/87  soft-tissue]
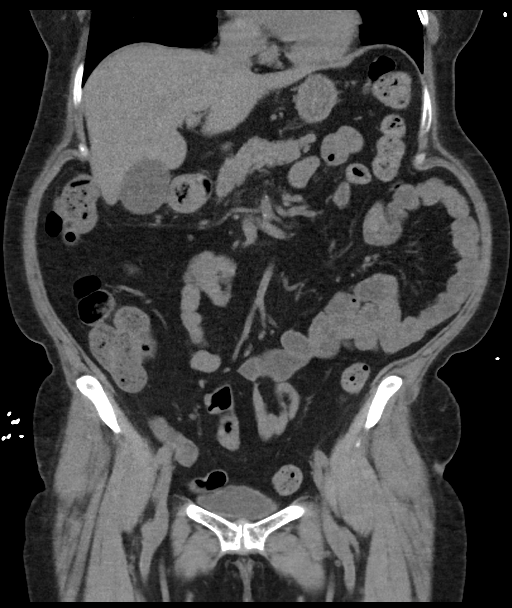

[16 of 46 positions shown; findings below may reference images not displayed]

FINDINGS: Lower chest: Minimal lingula atelectasis or scarring. Otherwise
negative.

Hepatobiliary: Subtle evidence of cholelithiasis on coronal image
46. No pericholecystic inflammation. Negative noncontrast liver. No
bile duct enlargement.

Pancreas: Negative.

Spleen: Negative.

Adrenals/Urinary Tract: Normal adrenal glands. Nonobstructed kidneys
with no nephrolithiasis or pararenal inflammation identified.
Ureters are decompressed and appear negative. Unremarkable bladder.

Stomach/Bowel: Redundant large bowel with mild retained stool.
Normal appendix on coronal image 42. Negative terminal ileum.
Fluid-filled and gas containing but nondilated small bowel loops
throughout the abdomen and pelvis, but no abnormally dilated loops
or abrupt transition. Decompressed stomach and duodenum. Duodenal
diverticulum in the midline (coronal image 42) with no active
inflammation. No free air, free fluid, or mesenteric inflammation
identified.

Vascular/Lymphatic: Aortoiliac calcified atherosclerosis. Normal
caliber abdominal aorta. No lymphadenopathy identified.

Reproductive: Negative noncontrast appearance.

Other: No pelvic free fluid.

Musculoskeletal: No acute osseous abnormality identified.
IMPRESSION: 1. Cholelithiasis, but no CT evidence of acute cholecystitis. Right
Upper Quadrant Ultrasound may be valuable in this setting.
2. No urinary calculus or obstructive uropathy.  Normal appendix.
3. Aortic Atherosclerosis ([id number]-[id number].S).
